# Patient Record
Sex: Female | Born: 1960 | Hispanic: No | Marital: Married | Smoking: Never smoker
Health system: Southern US, Community
[De-identification: ages and names within clinical notes are randomized; demographics above are authoritative.]

## PROBLEM LIST (undated history)

## (undated) DIAGNOSIS — R519 Headache, unspecified: Secondary | ICD-10-CM

## (undated) DIAGNOSIS — R5383 Other fatigue: Secondary | ICD-10-CM

## (undated) DIAGNOSIS — R51 Headache: Secondary | ICD-10-CM

## (undated) DIAGNOSIS — R42 Dizziness and giddiness: Secondary | ICD-10-CM

## (undated) HISTORY — DX: Other fatigue: R53.83

## (undated) HISTORY — PX: BACK SURGERY: SHX140

---

## 2017-06-22 ENCOUNTER — Encounter (INDEPENDENT_AMBULATORY_CARE_PROVIDER_SITE_OTHER): Payer: Self-pay | Admitting: *Deleted

## 2017-06-25 ENCOUNTER — Encounter: Payer: Self-pay | Admitting: *Deleted

## 2017-06-28 ENCOUNTER — Ambulatory Visit (HOSPITAL_COMMUNITY)
Admission: RE | Admit: 2017-06-28 | Discharge: 2017-06-28 | Disposition: A | Discharge status: Home / Self Care | Payer: Medicaid Other | Source: Ambulatory Visit | Attending: Internal Medicine | Admitting: Internal Medicine

## 2017-06-28 ENCOUNTER — Other Ambulatory Visit (HOSPITAL_COMMUNITY): Payer: Self-pay | Admitting: Internal Medicine

## 2017-06-28 DIAGNOSIS — G8929 Other chronic pain: Secondary | ICD-10-CM

## 2017-06-28 DIAGNOSIS — M25512 Pain in left shoulder: Principal | ICD-10-CM

## 2017-07-06 ENCOUNTER — Other Ambulatory Visit: Payer: Self-pay | Admitting: Adult Health

## 2017-07-07 ENCOUNTER — Other Ambulatory Visit (HOSPITAL_COMMUNITY): Payer: Self-pay | Admitting: Internal Medicine

## 2017-07-07 DIAGNOSIS — Z1231 Encounter for screening mammogram for malignant neoplasm of breast: Secondary | ICD-10-CM

## 2017-07-16 ENCOUNTER — Ambulatory Visit (HOSPITAL_COMMUNITY): Payer: Medicaid Other

## 2017-07-16 ENCOUNTER — Encounter (HOSPITAL_COMMUNITY): Payer: Self-pay

## 2017-07-22 ENCOUNTER — Encounter: Payer: Self-pay | Admitting: *Deleted

## 2017-07-22 ENCOUNTER — Other Ambulatory Visit: Payer: Self-pay | Admitting: Adult Health

## 2017-12-08 ENCOUNTER — Ambulatory Visit (HOSPITAL_COMMUNITY)
Admission: RE | Admit: 2017-12-08 | Discharge: 2017-12-08 | Disposition: A | Discharge status: Home / Self Care | Payer: Medicaid Other | Source: Ambulatory Visit | Attending: Internal Medicine | Admitting: Internal Medicine

## 2017-12-08 ENCOUNTER — Other Ambulatory Visit (HOSPITAL_COMMUNITY): Payer: Self-pay | Admitting: Internal Medicine

## 2017-12-08 DIAGNOSIS — R634 Abnormal weight loss: Secondary | ICD-10-CM

## 2017-12-08 DIAGNOSIS — R1013 Epigastric pain: Secondary | ICD-10-CM

## 2017-12-08 DIAGNOSIS — R110 Nausea: Secondary | ICD-10-CM | POA: Insufficient documentation

## 2017-12-08 MED ORDER — IOPAMIDOL (ISOVUE-300) INJECTION 61%
100.0000 mL | Freq: Once | INTRAVENOUS | Status: AC | PRN
  Administered 2017-12-08: 100 mL via INTRAVENOUS

## 2017-12-11 ENCOUNTER — Emergency Department (HOSPITAL_COMMUNITY)
Admission: EM | Admit: 2017-12-11 | Discharge: 2017-12-11 | Disposition: A | Discharge status: Home / Self Care | Payer: Self-pay | Attending: Emergency Medicine | Admitting: Emergency Medicine

## 2017-12-11 ENCOUNTER — Emergency Department (HOSPITAL_COMMUNITY): Payer: Self-pay

## 2017-12-11 ENCOUNTER — Encounter (HOSPITAL_COMMUNITY): Payer: Self-pay | Admitting: *Deleted

## 2017-12-11 DIAGNOSIS — R1013 Epigastric pain: Secondary | ICD-10-CM | POA: Insufficient documentation

## 2017-12-11 HISTORY — DX: Headache, unspecified: R51.9

## 2017-12-11 HISTORY — DX: Headache: R51

## 2017-12-11 HISTORY — DX: Dizziness and giddiness: R42

## 2017-12-11 LAB — BASIC METABOLIC PANEL
ANION GAP: 10 (ref 5–15)
BUN: 6 mg/dL (ref 6–20)
CHLORIDE: 103 mmol/L (ref 101–111)
CO2: 26 mmol/L (ref 22–32)
Calcium: 9.5 mg/dL (ref 8.9–10.3)
Creatinine, Ser: 0.6 mg/dL (ref 0.44–1.00)
GFR calc non Af Amer: >60 mL/min (ref >60)
Glucose, Bld: 126 mg/dL — ABNORMAL HIGH (ref 65–99)
POTASSIUM: 3.4 mmol/L — AB (ref 3.5–5.1)
SODIUM: 139 mmol/L (ref 135–145)

## 2017-12-11 LAB — HEPATIC FUNCTION PANEL
ALK PHOS: 87 U/L (ref 38–126)
ALT: 24 U/L (ref 14–54)
AST: 28 U/L (ref 15–41)
Albumin: 4.4 g/dL (ref 3.5–5.0)
Bilirubin, Direct: 0.1 mg/dL (ref 0.1–0.5)
Indirect Bilirubin: 0.6 mg/dL (ref 0.3–0.9)
TOTAL PROTEIN: 8.4 g/dL — AB (ref 6.5–8.1)
Total Bilirubin: 0.7 mg/dL (ref 0.3–1.2)

## 2017-12-11 LAB — CBC WITH DIFFERENTIAL/PLATELET
Basophils Absolute: 0 10*3/uL (ref 0.0–0.1)
Basophils Relative: 0 %
EOS ABS: 0 10*3/uL (ref 0.0–0.7)
Eosinophils Relative: 0 %
HEMATOCRIT: 40.1 % (ref 36.0–46.0)
HEMOGLOBIN: 12.8 g/dL (ref 12.0–15.0)
LYMPHS ABS: 0.9 10*3/uL (ref 0.7–4.0)
Lymphocytes Relative: 15 %
MCH: 28.1 pg (ref 26.0–34.0)
MCHC: 31.9 g/dL (ref 30.0–36.0)
MCV: 87.9 fL (ref 78.0–100.0)
MONOS PCT: 6 %
Monocytes Absolute: 0.4 10*3/uL (ref 0.1–1.0)
NEUTROS ABS: 4.8 10*3/uL (ref 1.7–7.7)
NEUTROS PCT: 79 %
Platelets: 291 10*3/uL (ref 150–400)
RBC: 4.56 MIL/uL (ref 3.87–5.11)
RDW: 12.3 % (ref 11.5–15.5)
WBC: 6.2 10*3/uL (ref 4.0–10.5)

## 2017-12-11 LAB — TROPONIN I: Troponin I: <0.03 ng/mL (ref <0.03)

## 2017-12-11 LAB — LIPASE, BLOOD: Lipase: 22 U/L (ref 11–51)

## 2017-12-11 LAB — D-DIMER, QUANTITATIVE (NOT AT ARMC): D DIMER QUANT: 0.67 ug{FEU}/mL — AB (ref 0.00–0.50)

## 2017-12-11 MED ORDER — GI COCKTAIL ~~LOC~~
30.0000 mL | Freq: Once | ORAL | Status: AC
  Administered 2017-12-11: 30 mL via ORAL
  Filled 2017-12-11: qty 30

## 2017-12-11 MED ORDER — FAMOTIDINE 20 MG PO TABS: 20.0000 mg | ORAL_TABLET | Freq: Two times a day (BID) | ORAL | 0 refills | Status: AC

## 2017-12-11 MED ORDER — PANTOPRAZOLE SODIUM 20 MG PO TBEC: 20.0000 mg | DELAYED_RELEASE_TABLET | Freq: Every day | ORAL | 0 refills | Status: AC

## 2017-12-11 MED ORDER — IOPAMIDOL (ISOVUE-370) INJECTION 76%
100.0000 mL | Freq: Once | INTRAVENOUS | Status: AC | PRN
  Administered 2017-12-11: 100 mL via INTRAVENOUS

## 2017-12-11 MED ORDER — PANTOPRAZOLE SODIUM 40 MG PO TBEC
40.0000 mg | DELAYED_RELEASE_TABLET | Freq: Once | ORAL | Status: AC
  Administered 2017-12-11: 40 mg via ORAL
  Filled 2017-12-11: qty 1

## 2017-12-11 MED ORDER — FAMOTIDINE 20 MG PO TABS
40.0000 mg | ORAL_TABLET | Freq: Once | ORAL | Status: AC
  Administered 2017-12-11: 40 mg via ORAL
  Filled 2017-12-11: qty 2

## 2017-12-11 NOTE — ED Notes (Signed)
ekg handed off to Dr. Cook 

## 2017-12-11 NOTE — ED Notes (Signed)
Pt does not speak ENglish, speaks Urdu.  Pt has her son who speaks English very well.

## 2017-12-11 NOTE — ED Triage Notes (Signed)
Pt with mid chest burning, has [facility] times last 2 weeks ago.  CT scan done here. Pt with continued heartburn despite medication.

## 2017-12-11 NOTE — ED Provider Notes (Signed)
[facility] EMERGENCY DEPARTMENT Provider Note   CSN: [id number] Arrival date & time: 12/11/17  1400     History   Chief Complaint Chief Complaint  Patient presents with  . Heartburn    HPI Heather Montoya is a 57 y.o. female.  A language interpreter was used.  Heartburn     Pt was seen at 1715.  Per pt and her family, c/o gradual onset and worsening of constant lower chest/upper abd "pain" for the past 3 weeks. Has been associated with no other symptoms.  Describes the abd pain as "burning."  Denies N/V, no diarrhea, no fevers, no back pain, no rash, no CP/SOB, no black or blood in stools. Pt has been evaluated for this complaint multiple times at [facility] ED and her PMD. Pt has been taking Maalox and zantac without improvement.         Past Medical History:  Diagnosis Date  . Dizziness   . Fatigue   . Headache     There are no active problems to display for this patient.   Past Surgical History:  Procedure Laterality Date  . BACK SURGERY       OB History    No data available       Home Medications    Prior to Admission medications   Not on File    Family History No family history on file.   Social History Social History   Tobacco Use  . Smoking status: Never Smoker  . Smokeless tobacco: Never Used  Substance Use Topics  . Alcohol use: No  . Drug use: Not on file     Allergies   Patient has no known allergies.   Review of Systems Review of Systems  Gastrointestinal: Positive for heartburn.  ROS: Statement: All systems negative except as marked or noted in the HPI; Constitutional: Negative for fever and chills. ; ; Eyes: Negative for eye pain, redness and discharge. ; ; ENMT: Negative for ear pain, hoarseness, nasal congestion, sinus pressure and sore throat. ; ; Cardiovascular: +lower chest pain. Negative for palpitations, diaphoresis, dyspnea and peripheral edema. ; ; Respiratory: Negative for cough, wheezing and stridor. ; ; Gastrointestinal:  +upper abd pain. Negative for nausea, vomiting, diarrhea, blood in stool, hematemesis, jaundice and rectal bleeding. . ; ; Genitourinary: Negative for dysuria, flank pain and hematuria. ; ; Musculoskeletal: Negative for back pain and neck pain. Negative for swelling and trauma.; ; Skin: Negative for pruritus, rash, abrasions, blisters, bruising and skin lesion.; ; Neuro: Negative for headache, lightheadedness and neck stiffness. Negative for weakness, altered level of consciousness, altered mental status, extremity weakness, paresthesias, involuntary movement, seizure and syncope.      Physical Exam Updated Vital Signs BP (!) 142/91 (BP Location: Right Arm)   Pulse 98   Temp 98.4 F (36.9 C) (Oral)   Resp 18   Ht 5\' 5"  (1.651 m)   Wt 71.2 kg (157 lb)   SpO2 100%   BMI 26.13 kg/m    BP 124/88 (BP Location: Right Arm)   Pulse 90   Temp 98.4 F (36.9 C) (Oral)   Resp 16   Ht 5\' 5"  (1.651 m)   Wt 71.2 kg (157 lb)   SpO2 96%   BMI 26.13 kg/m    Physical Exam 1720: Physical examination:  Nursing notes reviewed; Vital signs and O2 SAT reviewed;  Constitutional: Well developed, Well nourished, Well hydrated, In no acute distress; Head:  Normocephalic, atraumatic; Eyes: EOMI, PERRL, No scleral icterus; ENMT:  Mouth and pharynx normal, Mucous membranes moist; Neck: Supple, Full range of motion, No lymphadenopathy; Cardiovascular: Regular rate and rhythm, No gallop; Respiratory: Breath sounds clear & equal bilaterally, No wheezes.  Speaking full sentences with ease, Normal respiratory effort/excursion; Chest: Nontender, Movement normal; Abdomen: Soft, +mid-epigastric tenderness to palp. No rebound or guarding.  Nondistended, Normal bowel sounds; Genitourinary: No CVA tenderness; Extremities: Pulses normal, No tenderness, No edema, No calf edema or asymmetry.; Neuro: AA&Ox3, Major CN grossly intact.  Speech clear. No gross focal motor or sensory deficits in extremities. Climbs on and off stretcher  easily by herself. Gait steady..; Skin: Color normal, Warm, Dry.   ED Treatments / Results  Labs (all labs ordered are listed, but only abnormal results are displayed)   EKG  EKG Interpretation  Date/Time:  Saturday December 11 2017 14:25:52 EST Ventricular Rate:  98 PR Interval:  158 QRS Duration: 78 QT Interval:  336 QTC Calculation: 428 R Axis:   52 Text Interpretation:  Normal sinus rhythm Normal ECG No old tracing to compare Confirmed by Samuel Jester [phone]) on 12/11/2017 5:22:10 PM       Radiology   Procedures Procedures (including critical care time)  Medications Ordered in ED Medications  gi cocktail (Maalox,Lidocaine,Donnatal) (not administered)  famotidine (PEPCID) tablet 40 mg (not administered)  pantoprazole (PROTONIX) EC tablet 40 mg (not administered)     Initial Impression / Assessment and Plan / ED Course  I have reviewed the triage vital signs and the nursing notes.  Pertinent labs & imaging results that were available during my care of the patient were reviewed by me and considered in my medical decision making (see chart for details).  MDM Reviewed: previous chart, nursing note and vitals Reviewed previous: labs and CT scan Interpretation: labs, ECG and x-ray   Results for orders placed or performed during the hospital encounter of 12/11/17  Basic metabolic panel  Result Value Ref Range   Sodium 139 135 - 145 mmol/L   Potassium 3.4 (L) 3.5 - 5.1 mmol/L   Chloride 103 101 - 111 mmol/L   CO2 26 22 - 32 mmol/L   Glucose, Bld 126 (H) 65 - 99 mg/dL   BUN 6 6 - 20 mg/dL   Creatinine, Ser 6.04 0.44 - 1.00 mg/dL   Calcium 9.5 8.9 - 54.0 mg/dL   GFR calc non Af Amer >60 >60 mL/min   GFR calc Af Amer >60 >60 mL/min   Anion gap 10 5 - 15  CBC with Differential  Result Value Ref Range   WBC 6.2 4.0 - 10.5 K/uL   RBC 4.56 3.87 - 5.11 MIL/uL   Hemoglobin 12.8 12.0 - 15.0 g/dL   HCT 98.1 19.1 - 47.8 %   MCV 87.9 78.0 - 100.0 fL   MCH 28.1  26.0 - 34.0 pg   MCHC 31.9 30.0 - 36.0 g/dL   RDW 29.5 62.1 - 30.8 %   Platelets 291 150 - 400 K/uL   Neutrophils Relative % 79 %   Neutro Abs 4.8 1.7 - 7.7 K/uL   Lymphocytes Relative 15 %   Lymphs Abs 0.9 0.7 - 4.0 K/uL   Monocytes Relative 6 %   Monocytes Absolute 0.4 0.1 - 1.0 K/uL   Eosinophils Relative 0 %   Eosinophils Absolute 0.0 0.0 - 0.7 K/uL   Basophils Relative 0 %   Basophils Absolute 0.0 0.0 - 0.1 K/uL  Troponin I  Result Value Ref Range   Troponin I <0.03 <0.03 ng/mL  D-dimer,  quantitative  Result Value Ref Range   D-Dimer, Quant 0.67 (H) 0.00 - 0.50 ug/mL-FEU  Lipase, blood  Result Value Ref Range   Lipase 22 11 - 51 U/L  Hepatic function panel  Result Value Ref Range   Total Protein 8.4 (H) 6.5 - 8.1 g/dL   Albumin 4.4 3.5 - 5.0 g/dL   AST 28 15 - 41 U/L   ALT 24 14 - 54 U/L   Alkaline Phosphatase 87 38 - 126 U/L   Total Bilirubin 0.7 0.3 - 1.2 mg/dL   Bilirubin, Direct 0.1 0.1 - 0.5 mg/dL   Indirect Bilirubin 0.6 0.3 - 0.9 mg/dL  Troponin I  Result Value Ref Range   Troponin I <0.03 <0.03 ng/mL    Ct Abdomen W Contrast Result Date: 12/08/2017 CLINICAL DATA:  Epigastric pain with nausea. EXAM: CT ABDOMEN WITH CONTRAST TECHNIQUE: Multidetector CT imaging of the abdomen was performed using the standard protocol following bolus administration of intravenous contrast. CONTRAST:  100mL ISOVUE-300 IOPAMIDOL (ISOVUE-300) INJECTION 61% COMPARISON:  None. FINDINGS: Lower chest: Unremarkable Hepatobiliary: No focal abnormality within the liver parenchyma. There is no evidence for gallstones, gallbladder wall thickening, or pericholecystic fluid. No intrahepatic or extrahepatic biliary dilation. Pancreas: No focal mass lesion. No dilatation of the main duct. No intraparenchymal cyst. No peripancreatic edema. Spleen: No splenomegaly. No focal mass lesion. Adrenals/Urinary Tract: No adrenal nodule or mass. Kidneys unremarkable. Stomach/Bowel: Stomach is nondistended. No  gastric wall thickening. No evidence of outlet obstruction. Duodenum is normally positioned as is the ligament of Treitz. Visualize small bowel loops and colonic segments are nondilated. Vascular/Lymphatic: No abdominal aortic aneurysm. No abdominal aortic atherosclerotic calcification. Portal vein and superior mesenteric vein are patent. There is no gastrohepatic or hepatoduodenal ligament lymphadenopathy. No intraperitoneal or retroperitoneal lymphadenopathy. Other: No intraperitoneal free fluid. Musculoskeletal: Bone windows reveal no worrisome lytic or sclerotic osseous lesions. IMPRESSION: 1. No acute findings in the abdomen. Specifically, no findings to explain the history of epigastric pain and nausea. Electronically Signed   By: Kennith CenterEric  Mansell M.D.   On: 12/08/2017 13:55   Dg Chest 2 View Result Date: 12/11/2017 CLINICAL DATA:  Epigastric pain x 3 weeks, hx of reflux and heartburn EXAM: CHEST  2 VIEW COMPARISON:  None. FINDINGS: The heart size and mediastinal contours are within normal limits. Both lungs are clear. No pleural effusion or pneumothorax. The visualized skeletal structures are unremarkable. IMPRESSION: No active cardiopulmonary disease. Electronically Signed   By: Amie Portlandavid  Ormond M.D.   On: 12/11/2017 15:11    Ct Angio Chest Pe W/cm &/or Wo Cm Result Date: 12/11/2017 CLINICAL DATA:  Chest burning EXAM: CT ANGIOGRAPHY CHEST WITH CONTRAST TECHNIQUE: Multidetector CT imaging of the chest was performed using the standard protocol during bolus administration of intravenous contrast. Multiplanar CT image reconstructions and MIPs were obtained to evaluate the vascular anatomy. CONTRAST:  80 mL ISOVUE-370 IOPAMIDOL (ISOVUE-370) INJECTION 76% COMPARISON:  Chest x-ray 12/11/2017, CT abdomen pelvis 12/08/2017 FINDINGS: Cardiovascular: Satisfactory opacification of the pulmonary arteries to the segmental level. No evidence of pulmonary embolism. Nonaneurysmal aorta. No dissection is seen. Normal heart  size. No pericardial effusion. Mediastinum/Nodes: No enlarged mediastinal, hilar, or axillary lymph nodes. Thyroid gland, trachea, and esophagus demonstrate no significant findings. Lungs/Pleura: No pleural effusion, pneumothorax or focal pulmonary infiltrate. Pulmonary nodule in the medial right lung base measuring 7 mm average diameter, series 7, image number 87. Upper Abdomen: No acute abnormality. Musculoskeletal: No chest wall abnormality. No acute or significant osseous findings. Review of the MIP  images confirms the above findings. IMPRESSION: 1. Negative for acute pulmonary embolus or aortic dissection 2. 7 mm right lung base pulmonary nodule. Non-contrast chest CT at 6-12 months is recommended. If the nodule is stable at time of repeat CT, then future CT at 18-24 months (from today's scan) is considered optional for low-risk patients, but is recommended for high-risk patients. This recommendation follows the consensus statement: Guidelines for Management of Incidental Pulmonary Nodules Detected on CT Images: From the Fleischner Society 2017; Radiology 2017; 284:228-243. Electronically Signed   By: Jasmine Pang M.D.   On: 12/11/2017 19:16      CT-A chest did not cross over into Epic initially: 1) Negative for acute pulmonary embolus or aortic dissection 2) 7mm right lung base pulmonary nodule. Non-contrast chest CT 6-12 months is recommended.    2025:  Pt feels better after meds and wants to go home now. Doubt PE as cause for symptoms with negative CT-A chest.  Doubt ACS as cause for symptoms with normal troponin and EKG after 3 weeks of constant symptoms. Tx symptomatically, f/u GI MD. Dx and testing d/w pt and family.  Questions answered.  Verb understanding, agreeable to d/c home with outpt f/u.     Final Clinical Impressions(s) / ED Diagnoses   Final diagnoses:  None    ED Discharge Orders    None        Samuel Jester, DO 12/14/17 1233

## 2017-12-11 NOTE — Discharge Instructions (Signed)
Eat a bland diet, avoiding greasy, fatty, fried foods, as well as spicy and acidic foods or beverages.  Avoid eating within 2 to 3 hours before going to bed or laying down.  Also avoid teas, colas, coffee, chocolate, pepermint and spearment.  Take the prescriptions as directed.  May also take over the counter maalox/mylanta, as directed on packaging, as needed for discomfort. Your CT scan showed an incidental finding: "7mm right lung base pulmonary nodule. Non-contrast chest CT at 6-12 months is recommended." Your regular medical doctor can follow up this finding.  Call your regular medical doctor on Monday to schedule a follow up appointment in the next 3 days. Call the GI doctor on Monday to schedule a follow up appointment within the next week.  Return to the Emergency Department immediately if worsening.

## 2017-12-13 ENCOUNTER — Encounter: Payer: Self-pay | Admitting: Internal Medicine

## 2018-02-03 ENCOUNTER — Encounter: Payer: Self-pay | Admitting: Gastroenterology

## 2018-02-03 ENCOUNTER — Telehealth: Payer: Self-pay | Admitting: Gastroenterology

## 2018-02-03 ENCOUNTER — Ambulatory Visit: Payer: Self-pay | Admitting: Gastroenterology

## 2018-02-03 NOTE — Telephone Encounter (Signed)
Patient was a no show and letter sent  °

## 2019-08-04 IMAGING — CT CT ANGIO CHEST
2 of 6 series · 18 of 46 positions shown · IV contrast (Isovue)
Comparison: Chest x-ray 12/11/2017, CT abdomen pelvis 12/08/2017

CLINICAL DATA: Chest burning

EXAM:
CT ANGIOGRAPHY CHEST WITH CONTRAST
TECHNIQUE: Multidetector CT imaging of the chest was performed using the
standard protocol during bolus administration of intravenous
contrast. Multiplanar CT image reconstructions and MIPs were
obtained to evaluate the vascular anatomy.
CONTRAST:  80 mL A1GRT7-99N IOPAMIDOL (A1GRT7-99N) INJECTION 76%

[Series 6: thins · axial · 0.57mm/px · z∈[+1887,+2134]mm · 15 of 271 slices shown]
[im 12/271  lung]
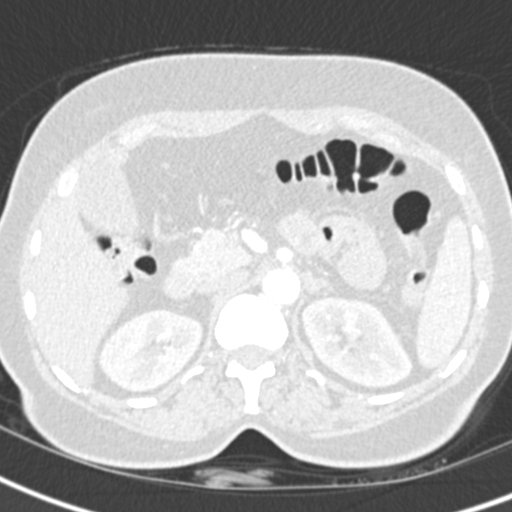
[im 36/271  soft-tissue]
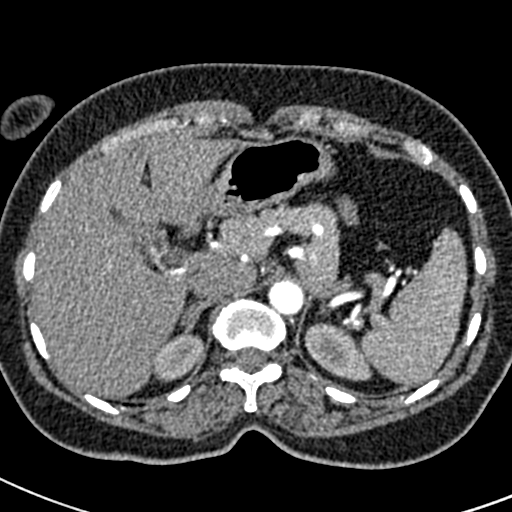
[im 47/271  lung]
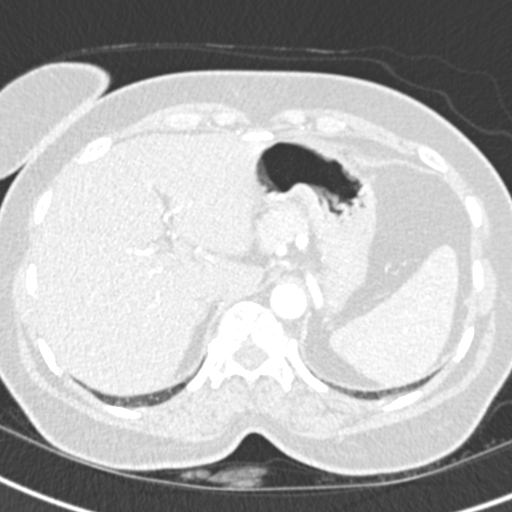
[im 71/271  soft-tissue]
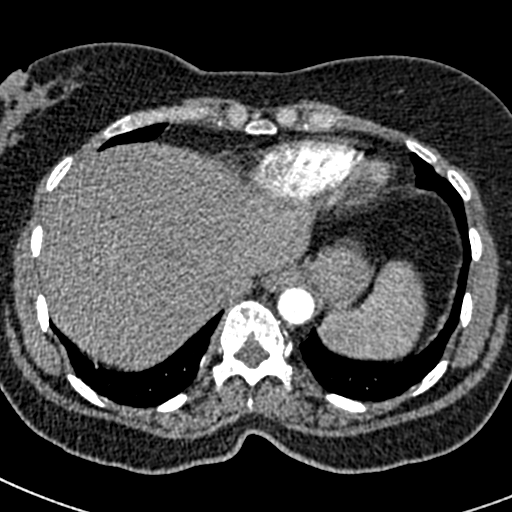
[im 83/271  lung]
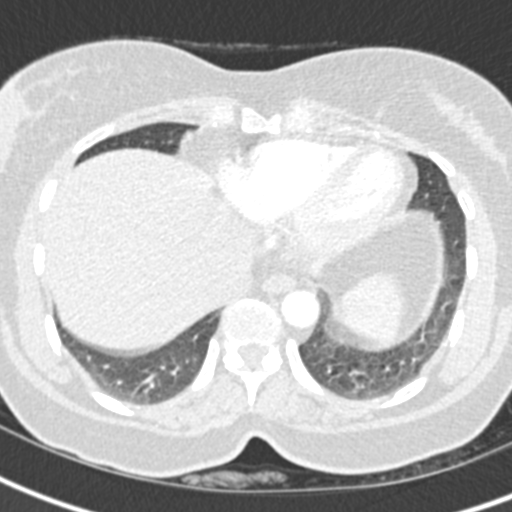
[im 106/271  soft-tissue]
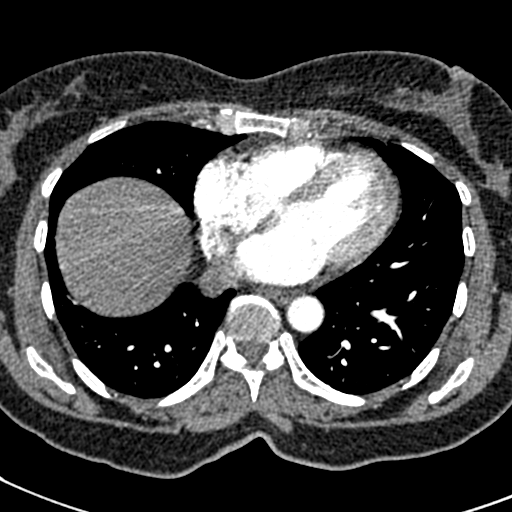
[im 118/271  lung]
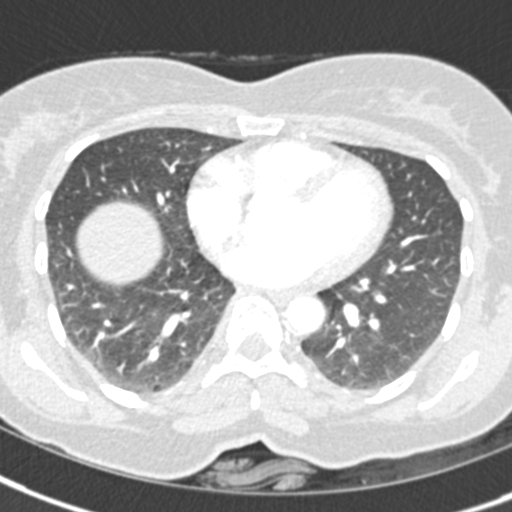
[im 141/271  soft-tissue]
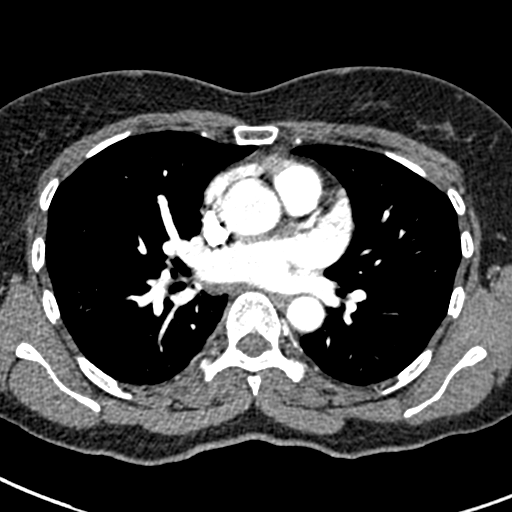
[im 153/271  lung]
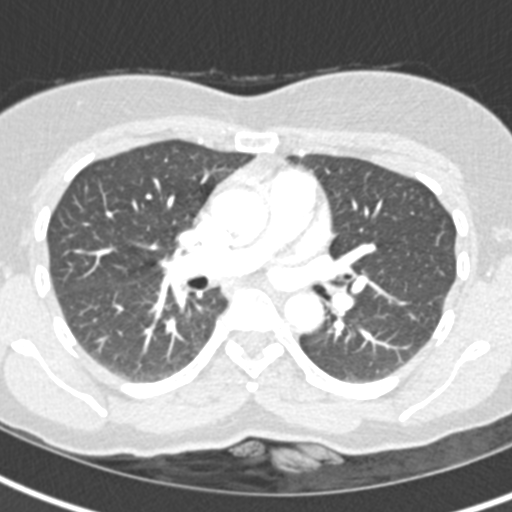
[im 165/271  soft-tissue]
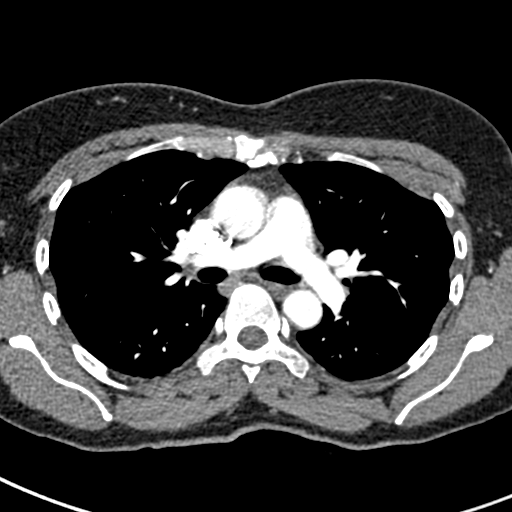
[im 188/271  lung]
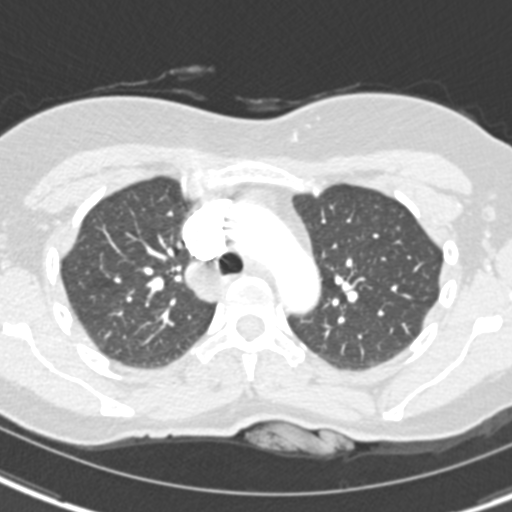
[im 200/271  soft-tissue]
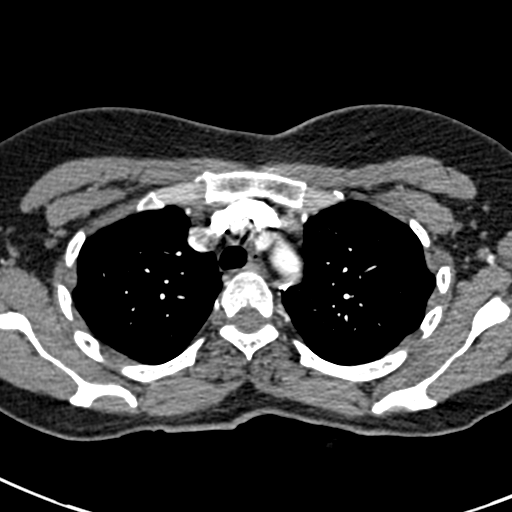
[im 224/271  lung]
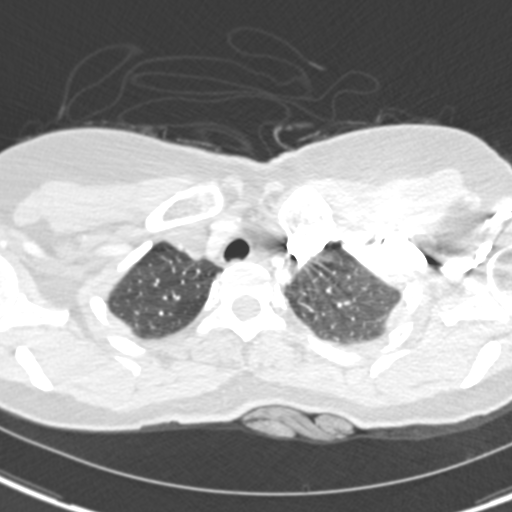
[im 235/271  soft-tissue]
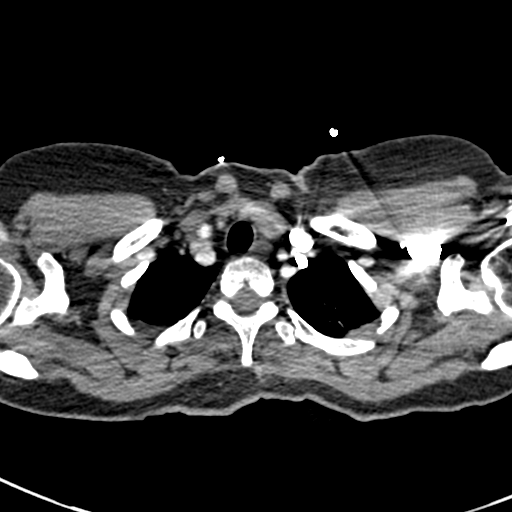
[im 259/271  lung]
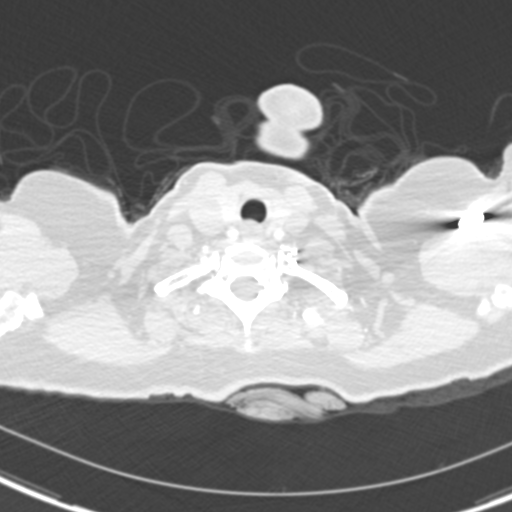

[Series 8: coronal mpr · coronal · 0.55mm/px · 3 of 116 slices shown]
[im 29/116  soft-tissue]
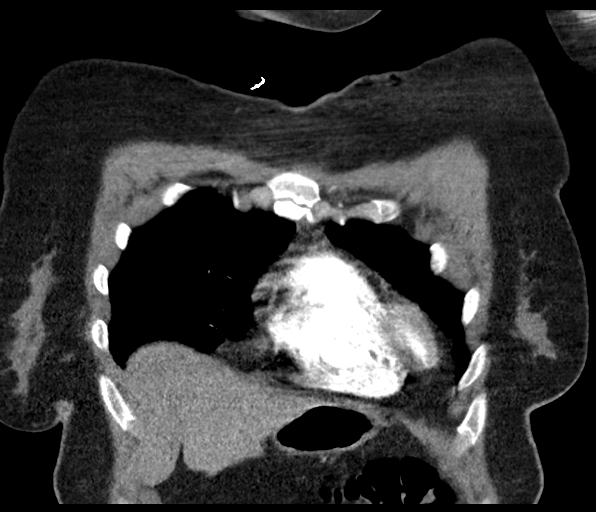
[im 58/116  soft-tissue]
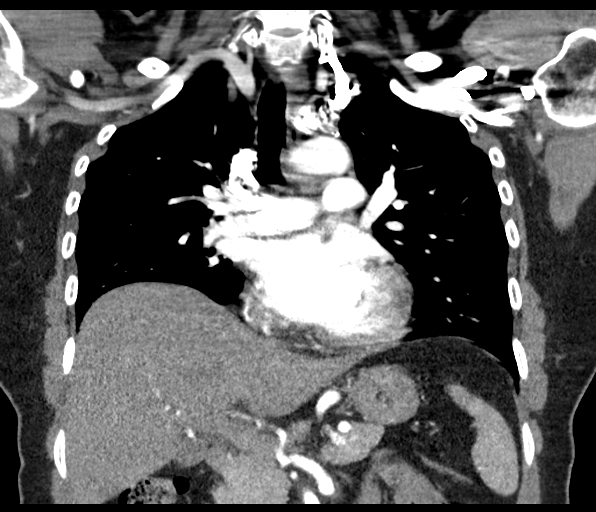
[im 87/116  soft-tissue]
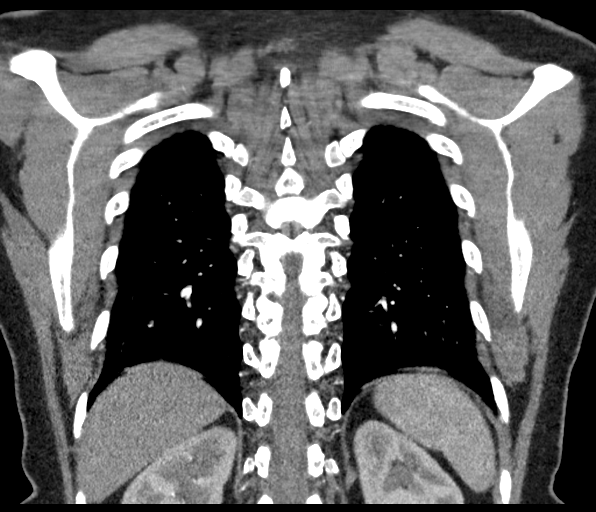

[18 of 46 positions shown; findings below may reference images not displayed]

FINDINGS: Cardiovascular: Satisfactory opacification of the pulmonary arteries
to the segmental level. No evidence of pulmonary embolism.
Nonaneurysmal aorta. No dissection is seen. Normal heart size. No
pericardial effusion.

Mediastinum/Nodes: No enlarged mediastinal, hilar, or axillary lymph
nodes. Thyroid gland, trachea, and esophagus demonstrate no
significant findings.

Lungs/Pleura: No pleural effusion, pneumothorax or focal pulmonary
infiltrate. Pulmonary nodule in the medial right lung base measuring
7 mm average diameter, series 7, image number 87.

Upper Abdomen: No acute abnormality.

Musculoskeletal: No chest wall abnormality. No acute or significant
osseous findings.

Review of the MIP images confirms the above findings.
IMPRESSION: 1. Negative for acute pulmonary embolus or aortic dissection
2. 7 mm right lung base pulmonary nodule. Non-contrast chest CT at
6-12 months is recommended. If the nodule is stable at time of
repeat CT, then future CT at 18-24 months (from today's scan) is
considered optional for low-risk patients, but is recommended for
high-risk patients. This recommendation follows the consensus
statement: Guidelines for Management of Incidental Pulmonary Nodules
Detected on CT Images: From the [HOSPITAL] 3500; Radiology

## 2021-05-22 ENCOUNTER — Encounter (INDEPENDENT_AMBULATORY_CARE_PROVIDER_SITE_OTHER): Payer: Self-pay
